# Patient Record
Sex: Female | Born: 1985 | Race: Black or African American | Hispanic: No | Marital: Single | Smoking: Current some day smoker
Health system: Southern US, Community
[De-identification: ages and names within clinical notes are randomized; demographics above are authoritative.]

## PROBLEM LIST (undated history)

## (undated) DIAGNOSIS — A64 Unspecified sexually transmitted disease: Secondary | ICD-10-CM

---
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
## 2015-10-29 ENCOUNTER — Emergency Department (HOSPITAL_COMMUNITY): Payer: No Typology Code available for payment source

## 2015-10-29 ENCOUNTER — Emergency Department (HOSPITAL_COMMUNITY)
Admission: EM | Admit: 2015-10-29 | Discharge: 2015-10-30 | Disposition: A | Discharge status: Home / Self Care | Payer: No Typology Code available for payment source | Attending: Emergency Medicine | Admitting: Emergency Medicine

## 2015-10-29 ENCOUNTER — Encounter (HOSPITAL_COMMUNITY): Payer: Self-pay | Admitting: *Deleted

## 2015-10-29 DIAGNOSIS — S50312A Abrasion of left elbow, initial encounter: Secondary | ICD-10-CM | POA: Insufficient documentation

## 2015-10-29 DIAGNOSIS — F419 Anxiety disorder, unspecified: Secondary | ICD-10-CM | POA: Diagnosis not present

## 2015-10-29 DIAGNOSIS — Y999 Unspecified external cause status: Secondary | ICD-10-CM | POA: Diagnosis not present

## 2015-10-29 DIAGNOSIS — Y92410 Unspecified street and highway as the place of occurrence of the external cause: Secondary | ICD-10-CM | POA: Insufficient documentation

## 2015-10-29 DIAGNOSIS — Z79899 Other long term (current) drug therapy: Secondary | ICD-10-CM | POA: Diagnosis not present

## 2015-10-29 DIAGNOSIS — S40211A Abrasion of right shoulder, initial encounter: Secondary | ICD-10-CM | POA: Insufficient documentation

## 2015-10-29 DIAGNOSIS — T07XXXA Unspecified multiple injuries, initial encounter: Secondary | ICD-10-CM

## 2015-10-29 DIAGNOSIS — F17200 Nicotine dependence, unspecified, uncomplicated: Secondary | ICD-10-CM | POA: Diagnosis not present

## 2015-10-29 DIAGNOSIS — S80211A Abrasion, right knee, initial encounter: Secondary | ICD-10-CM | POA: Insufficient documentation

## 2015-10-29 DIAGNOSIS — S06320A Contusion and laceration of left cerebrum without loss of consciousness, initial encounter: Secondary | ICD-10-CM | POA: Insufficient documentation

## 2015-10-29 DIAGNOSIS — Y939 Activity, unspecified: Secondary | ICD-10-CM | POA: Diagnosis not present

## 2015-10-29 DIAGNOSIS — Z23 Encounter for immunization: Secondary | ICD-10-CM | POA: Diagnosis not present

## 2015-10-29 DIAGNOSIS — S0990XA Unspecified injury of head, initial encounter: Secondary | ICD-10-CM | POA: Diagnosis present

## 2015-10-29 HISTORY — DX: Unspecified sexually transmitted disease: A64

## 2015-10-29 LAB — COMPREHENSIVE METABOLIC PANEL
ALT: 12 U/L — AB (ref 14–54)
AST: 20 U/L (ref 15–41)
Albumin: 4.1 g/dL (ref 3.5–5.0)
Alkaline Phosphatase: 37 U/L — ABNORMAL LOW (ref 38–126)
Anion gap: 8 (ref 5–15)
BUN: 7 mg/dL (ref 6–20)
CHLORIDE: 111 mmol/L (ref 101–111)
CO2: 22 mmol/L (ref 22–32)
CREATININE: 0.76 mg/dL (ref 0.44–1.00)
Calcium: 9.2 mg/dL (ref 8.9–10.3)
GFR calc Af Amer: >60 mL/min (ref >60)
GLUCOSE: 74 mg/dL (ref 65–99)
Potassium: 3.4 mmol/L — ABNORMAL LOW (ref 3.5–5.1)
Sodium: 141 mmol/L (ref 135–145)
Total Bilirubin: 1 mg/dL (ref 0.3–1.2)
Total Protein: 7.2 g/dL (ref 6.5–8.1)

## 2015-10-29 LAB — CBC WITH DIFFERENTIAL/PLATELET
BASOS PCT: 0 %
Basophils Absolute: 0 10*3/uL (ref 0.0–0.1)
EOS PCT: 0 %
Eosinophils Absolute: 0 10*3/uL (ref 0.0–0.7)
HEMATOCRIT: 33.2 % — AB (ref 36.0–46.0)
Hemoglobin: 10.6 g/dL — ABNORMAL LOW (ref 12.0–15.0)
LYMPHS ABS: 1.2 10*3/uL (ref 0.7–4.0)
Lymphocytes Relative: 7 %
MCH: 22 pg — AB (ref 26.0–34.0)
MCHC: 31.9 g/dL (ref 30.0–36.0)
MCV: 68.9 fL — AB (ref 78.0–100.0)
MONOS PCT: 7 %
Monocytes Absolute: 1.2 10*3/uL — ABNORMAL HIGH (ref 0.1–1.0)
NEUTROS ABS: 14.6 10*3/uL — AB (ref 1.7–7.7)
Neutrophils Relative %: 86 %
Platelets: 212 10*3/uL (ref 150–400)
RBC: 4.82 MIL/uL (ref 3.87–5.11)
RDW: 15.4 % (ref 11.5–15.5)
WBC: 17 10*3/uL — ABNORMAL HIGH (ref 4.0–10.5)

## 2015-10-29 LAB — I-STAT BETA HCG BLOOD, ED (MC, WL, AP ONLY): I-stat hCG, quantitative: <5 m[IU]/mL (ref <5)

## 2015-10-29 LAB — ACETAMINOPHEN LEVEL: Acetaminophen (Tylenol), Serum: <10 ug/mL — ABNORMAL LOW (ref 10–30)

## 2015-10-29 LAB — SALICYLATE LEVEL: Salicylate Lvl: <4 mg/dL (ref 2.8–30.0)

## 2015-10-29 LAB — ETHANOL: ALCOHOL ETHYL (B): 61 mg/dL — AB (ref <5)

## 2015-10-29 MED ORDER — HYDROCODONE-ACETAMINOPHEN 5-325 MG PO TABS
1.0000 | ORAL_TABLET | Freq: Once | ORAL | Status: AC
  Administered 2015-10-29: 1 via ORAL
  Filled 2015-10-29: qty 1

## 2015-10-29 MED ORDER — ALUM & MAG HYDROXIDE-SIMETH 200-200-20 MG/5ML PO SUSP: 30.0000 mL | ORAL | Status: DC | PRN

## 2015-10-29 MED ORDER — ACETAMINOPHEN 325 MG PO TABS: 650.0000 mg | ORAL_TABLET | ORAL | Status: DC | PRN

## 2015-10-29 MED ORDER — LORAZEPAM 1 MG PO TABS: 1.0000 mg | ORAL_TABLET | Freq: Two times a day (BID) | ORAL | 0 refills | Status: AC | PRN

## 2015-10-29 MED ORDER — FENTANYL CITRATE (PF) 100 MCG/2ML IJ SOLN
50.0000 ug | Freq: Once | INTRAMUSCULAR | Status: AC
  Administered 2015-10-29: 50 ug via INTRAMUSCULAR
  Filled 2015-10-29: qty 2

## 2015-10-29 MED ORDER — ONDANSETRON HCL 4 MG PO TABS: 4.0000 mg | ORAL_TABLET | Freq: Three times a day (TID) | ORAL | Status: DC | PRN

## 2015-10-29 MED ORDER — IBUPROFEN 200 MG PO TABS: 600.0000 mg | ORAL_TABLET | Freq: Three times a day (TID) | ORAL | Status: DC | PRN

## 2015-10-29 MED ORDER — ZOLPIDEM TARTRATE 5 MG PO TABS: 5.0000 mg | ORAL_TABLET | Freq: Every evening | ORAL | Status: DC | PRN

## 2015-10-29 MED ORDER — BACITRACIN ZINC 500 UNIT/GM EX OINT
TOPICAL_OINTMENT | CUTANEOUS | Status: AC
  Administered 2015-10-30
  Filled 2015-10-29: qty 9

## 2015-10-29 MED ORDER — TETANUS-DIPHTH-ACELL PERTUSSIS 5-2.5-18.5 LF-MCG/0.5 IM SUSP
0.5000 mL | Freq: Once | INTRAMUSCULAR | Status: AC
  Administered 2015-10-29: 0.5 mL via INTRAMUSCULAR
  Filled 2015-10-29: qty 0.5

## 2015-10-29 NOTE — ED Notes (Signed)
Bed: WA08 Expected date:  Expected time:  Means of arrival:  Comments: resA
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
## 2015-10-29 NOTE — BH Assessment (Signed)
Spoke with Arvilla MeresAshley Meyer, PA-C who reported the TTS consult was cancelled and pt will be referred to [facility] for follow [facility].
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
## 2015-10-29 NOTE — ED Notes (Signed)
Patient transported to CT 
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
## 2015-10-29 NOTE — Discharge Instructions (Signed)
Call [facility] for out patient appointment regarding your anxiety.  Wash your wounds daily,and apply antibiotic ointment.  Ativan as needed for anxiety, or at night for sleep.

## 2015-10-30 LAB — RAPID URINE DRUG SCREEN, HOSP PERFORMED
AMPHETAMINES: NOT DETECTED
BENZODIAZEPINES: NOT DETECTED
Barbiturates: NOT DETECTED
Cocaine: NOT DETECTED
Opiates: POSITIVE — AB
TETRAHYDROCANNABINOL: POSITIVE — AB

## 2015-10-30 MED ORDER — HYDROCODONE-ACETAMINOPHEN 5-325 MG PO TABS: 1.0000 | ORAL_TABLET | Freq: Three times a day (TID) | ORAL | 0 refills | Status: AC

## 2015-10-30 MED ORDER — ONDANSETRON 4 MG PO TBDP
4.0000 mg | ORAL_TABLET | Freq: Once | ORAL | Status: AC
  Administered 2015-10-30: 4 mg via ORAL
  Filled 2015-10-30: qty 1

## 2015-10-30 NOTE — ED Notes (Signed)
PT DISCHARGED. INSTRUCTIONS AND PRESCRIPTIONS GIVEN. AAOX4. PT IN NO APPARENT DISTRESS. THE OPPORTUNITY TO ASK QUESTIONS WAS PROVIDED. 

## 2015-10-31 ENCOUNTER — Encounter (HOSPITAL_COMMUNITY): Payer: Self-pay

## 2015-10-31 ENCOUNTER — Emergency Department (HOSPITAL_COMMUNITY)
Admission: EM | Admit: 2015-10-31 | Discharge: 2015-10-31 | Disposition: A | Discharge status: Home / Self Care | Payer: Self-pay | Attending: Emergency Medicine | Admitting: Emergency Medicine

## 2015-10-31 DIAGNOSIS — F17200 Nicotine dependence, unspecified, uncomplicated: Secondary | ICD-10-CM | POA: Insufficient documentation

## 2015-10-31 DIAGNOSIS — Z4800 Encounter for change or removal of nonsurgical wound dressing: Secondary | ICD-10-CM | POA: Insufficient documentation

## 2015-10-31 DIAGNOSIS — R21 Rash and other nonspecific skin eruption: Secondary | ICD-10-CM | POA: Insufficient documentation

## 2015-10-31 DIAGNOSIS — Z5189 Encounter for other specified aftercare: Secondary | ICD-10-CM

## 2015-10-31 DIAGNOSIS — Z79899 Other long term (current) drug therapy: Secondary | ICD-10-CM | POA: Insufficient documentation

## 2015-10-31 MED ORDER — SILVER SULFADIAZINE 1 % EX CREA: 1.0000 "application " | TOPICAL_CREAM | Freq: Every day | CUTANEOUS | 0 refills | Status: AC

## 2015-10-31 MED ORDER — SILVER SULFADIAZINE 1 % EX CREA
TOPICAL_CREAM | Freq: Once | CUTANEOUS | Status: AC
  Administered 2015-10-31: 04:00:00 via TOPICAL
  Filled 2015-10-31: qty 50

## 2015-10-31 MED ORDER — HYDROCODONE-ACETAMINOPHEN 5-325 MG PO TABS: 2.0000 | ORAL_TABLET | ORAL | 0 refills | Status: AC | PRN

## 2015-10-31 MED ORDER — OXYCODONE-ACETAMINOPHEN 5-325 MG PO TABS
1.0000 | ORAL_TABLET | Freq: Once | ORAL | Status: AC
  Administered 2015-10-31: 1 via ORAL
  Filled 2015-10-31: qty 1

## 2015-10-31 NOTE — ED Triage Notes (Signed)
Pt was seen yesterday and sent home with wound cleaning instructions, it's too painful to do at home and the pain meds arent' helping
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
## 2015-10-31 NOTE — ED Notes (Signed)
Bed: [facility] Expected date:  Expected time:  Means of arrival:  Comments: 
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
## 2019-05-20 ENCOUNTER — Ambulatory Visit: Payer: Self-pay | Attending: Internal Medicine

## 2023-06-24 ENCOUNTER — Other Ambulatory Visit (HOSPITAL_COMMUNITY): Payer: Self-pay
